# Patient Record
Sex: Female | Born: 1995 | Race: White | Hispanic: No | Marital: Single | Smoking: Never smoker
Health system: Southern US, Community
[De-identification: ages and names within clinical notes are randomized; demographics above are authoritative.]

## PROBLEM LIST (undated history)

## (undated) DIAGNOSIS — B019 Varicella without complication: Secondary | ICD-10-CM

## (undated) DIAGNOSIS — Z20818 Contact with and (suspected) exposure to other bacterial communicable diseases: Secondary | ICD-10-CM

## (undated) HISTORY — PX: WISDOM TOOTH EXTRACTION: SHX21

## (undated) HISTORY — DX: Varicella without complication: B01.9

## (undated) HISTORY — DX: Contact with and (suspected) exposure to other bacterial communicable diseases: Z20.818

## (undated) HISTORY — PX: NO PAST SURGERIES: SHX2092

---
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
## 2015-03-16 ENCOUNTER — Emergency Department (HOSPITAL_COMMUNITY)
Admission: EM | Admit: 2015-03-16 | Discharge: 2015-03-16 | Disposition: A | Discharge status: Home / Self Care | Payer: 59 | Attending: Emergency Medicine | Admitting: Emergency Medicine

## 2015-03-16 ENCOUNTER — Encounter (HOSPITAL_COMMUNITY): Payer: Self-pay

## 2015-03-16 DIAGNOSIS — T783XXA Angioneurotic edema, initial encounter: Secondary | ICD-10-CM | POA: Diagnosis not present

## 2015-03-16 DIAGNOSIS — Z79899 Other long term (current) drug therapy: Secondary | ICD-10-CM | POA: Diagnosis not present

## 2015-03-16 DIAGNOSIS — R220 Localized swelling, mass and lump, head: Secondary | ICD-10-CM | POA: Diagnosis present

## 2015-03-16 DIAGNOSIS — T470X5A Adverse effect of histamine H2-receptor blockers, initial encounter: Secondary | ICD-10-CM | POA: Diagnosis not present

## 2015-03-16 MED ORDER — EPINEPHRINE 0.3 MG/0.3ML IJ SOAJ: 0.3000 mg | Freq: Once | INTRAMUSCULAR | Status: DC

## 2015-03-16 MED ORDER — DIPHENHYDRAMINE HCL 25 MG PO CAPS: 25.0000 mg | ORAL_CAPSULE | Freq: Four times a day (QID) | ORAL | Status: AC | PRN

## 2015-03-16 MED ORDER — DIPHENHYDRAMINE HCL 25 MG PO CAPS
50.0000 mg | ORAL_CAPSULE | Freq: Once | ORAL | Status: AC
  Administered 2015-03-16: 50 mg via ORAL
  Filled 2015-03-16: qty 2

## 2015-03-16 NOTE — ED Notes (Signed)
PT DISCHARGED. INSTRUCTIONS AND PRESCRIPTION GIVEN. AAOX3. PT IN NO APPARENT DISTRESS OR PAIN. THE OPPORTUNITY TO ASK QUESTIONS WAS PROVIDED. 

## 2015-03-16 NOTE — ED Provider Notes (Signed)
CSN: [id number]     Arrival date & time 03/16/15  2000 History   By signing my name below, I, Arlan Organshley Leger, attest that this documentation has been prepared under the direction and in the presence of TRW AutomotiveKelly Aneka Fagerstrom, PA-C.  Electronically Signed: Arlan OrganAshley Leger, ED Scribe. 03/16/2015. 9:00 PM.   Chief Complaint  Patient presents with  . Allergic Reaction   The history is provided by the patient. No language interpreter was used.    HPI Comments: Anne Lynn is a 19 y.o. female without any pertinent past medical history who presents to the Emergency Department here for a possible allergic reaction this evening. Pt states she took a dose of Ranitidine for the first time followed by some swelling to the upper lip. Pt states swelling has mildly improve since onset but is still persistent. No OTC medications or home remedies attempted prior to arrival. No recent fever, chills, nausea, or vomiting. No scratchy throat, wheezing, or inability to swallow.  PCP: Wilber BihariBOETTE,RICHARD W, MD    No past medical history on file. No past surgical history on file. No family history on file. Social History  Substance Use Topics  . Smoking status: Never Smoker   . Smokeless tobacco: None  . Alcohol Use: None   OB History    No data available      Review of Systems  HENT: Positive for facial swelling.   All other systems reviewed and are negative.   Allergies  Other  Home Medications   Prior to Admission medications   Medication Sig Start Date End Date Taking? Authorizing Provider  diphenhydrAMINE (BENADRYL) 25 mg capsule Take 1 capsule (25 mg total) by mouth every 6 (six) hours as needed. 03/16/15   Antony MaduraKelly Miral Hoopes, PA-C  EPINEPHrine 0.3 mg/0.3 mL IJ SOAJ injection Inject 0.3 mLs (0.3 mg total) into the muscle once. Use as needed for throat swelling or difficulty breathing 03/16/15   Antony MaduraKelly Mazal Ebey, PA-C  famotidine (PEPCID) 20 MG tablet Take 1 tablet (20 mg total) by mouth 2 (two) times daily. 11/07/11  11/06/12  Hannah Muthersbaugh, PA-C  Multiple Vitamin (MULTIVITAMIN WITH MINERALS) TABS Take 1 tablet by mouth daily.    Historical Provider, MD   Triage Vitals: BP 115/53 mmHg  Pulse 60  Temp(Src) 98.1 F (36.7 C) (Oral)  Resp 16  SpO2 99%   Physical Exam  Constitutional: She is oriented to person, place, and time. She appears well-developed and well-nourished. No distress.  Nontoxic/nonseptic appearing  HENT:  Head: Normocephalic and atraumatic.  Mouth/Throat: Uvula is midline and mucous membranes are normal. No uvula swelling. No oropharyngeal exudate.  Mild swelling to the upper lip. No posterior oropharyngeal erythema or edema. Uvula midline. Patient tolerating secretions without difficulty.  Eyes: Conjunctivae and EOM are normal. No scleral icterus.  Neck: Normal range of motion.  No stridor. No nuchal rigidity or meningismus  Cardiovascular: Normal rate, regular rhythm and intact distal pulses.   Pulmonary/Chest: Effort normal. No respiratory distress. She has no wheezes. She has no rales.  Respirations even and unlabored. Lungs clear.  Musculoskeletal: Normal range of motion.  Neurological: She is alert and oriented to person, place, and time. She exhibits normal muscle tone. Coordination normal.  GCS 15. Patient ambulatory with steady gait.  Skin: Skin is warm and dry. No rash noted. She is not diaphoretic. No erythema. No pallor.  Psychiatric: She has a normal mood and affect. Her behavior is normal.  Nursing note and vitals reviewed.   ED Course  Procedures (  including critical care time)  DIAGNOSTIC STUDIES: Oxygen Saturation is 100% on RA, Normal by my interpretation.    COORDINATION OF CARE: 8:47 PM- Will give Benadryl. Discussed treatment plan with pt at bedside and pt agreed to plan.     Labs Review Labs Reviewed - No data to display  Imaging Review No results found.   I have personally reviewed and evaluated these images and lab results as part of my  medical decision-making.   EKG Interpretation None      MDM   Final diagnoses:  Angioedema of lips, initial encounter    19 year old female presents to the emergency department for evaluation of upper lip swelling after taking Zantac. She reports that symptoms have spontaneously improved prior to arrival. Patient given Benadryl and has been monitored for approximately 1 hour with no worsening of her symptoms. Oropharynx is clear. Patient is tolerating secretions without difficulty. Will discharge with Benadryl as well as EpiPen for worsening of symptoms as needed. Return precautions given at discharge. Patient discharged in good condition with no unaddressed concerns; VSS.  I personally performed the services described in this documentation, which was scribed in my presence. The recorded information has been reviewed and is accurate.    Filed Vitals:   03/16/15 2009 03/16/15 2148  BP: 130/73 115/53  Pulse: 68 60  Temp: 97.8 F (36.6 C) 98.1 F (36.7 C)  TempSrc: Oral Oral  Resp: 16 16  SpO2: 100% 99%     Antony Madura, PA-C 03/16/15 2210  Gwyneth Sprout, MD 03/20/15 0900

## 2015-03-16 NOTE — ED Notes (Signed)
Pt reports taking a ranitidine tablet for the first time for heartburn at home and noticed her upper lip beginning to swell. No respiratory complaints in triage. Upper lip appears slightly swollen. Throat and tongue appear normal at this time.

## 2015-03-16 NOTE — ED Notes (Signed)
INITIAL ASSESSMENT COMPLETED. PT C/O SWELLING TO THE UPPER LIP AFTER TAKING A ZANTAC PILL FOR THE FIRST TIME. PT DENIES RESPIRATORY DISTRESS OR DIFFICULTY SWALLOWING. AWAITING FURTHER ORDERS.

## 2015-03-16 NOTE — Discharge Instructions (Signed)
Angioedema  Angioedema is a sudden swelling of tissues, often of the skin. It can occur on the face or genitals or in the abdomen or other body parts. The swelling usually develops over a short period and gets better in 24 to 48 hours. It often begins during the night and is found when the person wakes up. The person may also get red, itchy patches of skin (hives). Angioedema can be dangerous if it involves swelling of the air passages.   Depending on the cause, episodes of angioedema may only happen once, come back in unpredictable patterns, or repeat for several years and then gradually fade away.   CAUSES   Angioedema can be caused by an allergic reaction to various triggers. It can also result from nonallergic causes, including reactions to drugs, immune system disorders, viral infections, or an abnormal gene that is passed to you from your parents (hereditary). For some people with angioedema, the cause is unknown.   Some things that can trigger angioedema include:    Foods.    Medicines, such as ACE inhibitors, ARBs, nonsteroidal anti-inflammatory agents, or estrogen.    Latex.    Animal saliva.    Insect stings.    Dyes used in X-rays.    Mild injury.    Dental work.   Surgery.   Stress.    Sudden changes in temperature.    Exercise.  SIGNS AND SYMPTOMS    Swelling of the skin.   Hives. If these are present, there is also intense itching.   Redness in the affected area.    Pain in the affected area.   Swollen lips or tongue.   Breathing problems. This may happen if the air passages swell.   Wheezing.  If internal organs are involved, there may be:    Nausea.    Abdominal pain.    Vomiting.    Difficulty swallowing.    Difficulty passing urine.  DIAGNOSIS    Your health care provider will examine the affected area and take a medical and family history.   Various tests may be done to help determine the cause. Tests may include:   Allergy skin tests to see if the problem  is an allergic reaction.    Blood tests to check for hereditary angioedema.    Tests to check for underlying diseases that could cause the condition.    A review of your medicines, including over-the-counter medicines, may be done.  TREATMENT   Treatment will depend on the cause of the angioedema. Possible treatments include:    Removal of anything that triggered the condition (such as stopping certain medicines).    Medicines to treat symptoms or prevent attacks. Medicines given may include:     Antihistamines.     Epinephrine injection.     Steroids.    Hospitalization may be required for severe attacks. If the air passages are affected, it can be an emergency. Tubes may need to be placed to keep the airway open.  HOME CARE INSTRUCTIONS    Take all medicines as directed by your health care provider.   If you were given medicines for emergency allergy treatment, always carry them with you.   Wear a medical bracelet as directed by your health care provider.    Avoid known triggers.  SEEK MEDICAL CARE IF:    You have repeat attacks of angioedema.    Your attacks are more frequent or more severe despite preventive measures.    You have hereditary angioedema   and are considering having children. It is important to discuss with your health care provider the risks of passing the condition on to your children.  SEEK IMMEDIATE MEDICAL CARE IF:    You have severe swelling of the mouth, tongue, or lips.   You have difficulty breathing.    You have difficulty swallowing.    You faint.  MAKE SURE YOU:   Understand these instructions.   Will watch your condition.   Will get help right away if you are not doing well or get worse.     This information is not intended to replace advice given to you by your health care provider. Make sure you discuss any questions you have with your health care provider.     Document Released: 05/24/2001 Document Revised: 04/05/2014 Document Reviewed:  11/06/2012  [employer] Interactive Patient Education 2016 [employer]

## 2015-09-04 ENCOUNTER — Ambulatory Visit: Payer: Managed Care, Other (non HMO) | Admitting: Allergy and Immunology

## 2015-10-01 ENCOUNTER — Encounter: Payer: Self-pay | Admitting: Allergy and Immunology

## 2015-10-01 ENCOUNTER — Ambulatory Visit (INDEPENDENT_AMBULATORY_CARE_PROVIDER_SITE_OTHER): Payer: 59 | Admitting: Allergy and Immunology

## 2015-10-01 VITALS — BP 100/68 | HR 72 | Temp 98.4°F | Resp 16 | Ht 65.5 in | Wt 172.2 lb

## 2015-10-01 DIAGNOSIS — Z91018 Allergy to other foods: Secondary | ICD-10-CM

## 2015-10-01 MED ORDER — EPINEPHRINE 0.3 MG/0.3ML IJ SOAJ: INTRAMUSCULAR | Status: DC

## 2015-10-01 NOTE — Progress Notes (Signed)
NEW PATIENT NOTE  Referring Provider: No ref. provider found Primary Provider: No PCP Per Patient Date of office visit: 10/01/2015    Subjective:   Chief Complaint:  Anne Lynn (DOB: [birth year]) is a 20 y.o. female with a chief complaint of Allergic Reaction  who presents to the clinic on 10/01/2015 with the following problems:  HPI: Anne Lynn presents to this clinic in evaluation of 2 allergic reactions. 18 months ago after eating whitefish and sauce she develop diffuse urticaria within approximately 30 minutes possibly associated with some shortness of breath for which she went to the emergency room and was treated with IV medications which made her quite sleepy. Her entire reaction was over within several hours and she never had any associated systemic or constitutional symptoms. Her lesions never healed with a scar or hyperpigmentation. There was no obvious trigger giving rise to this reaction except for possibly consumption of food products as described. Since that point in time she can eat salmon without any problem but stays away from all whitefish. She was given a prescription for an EpiPen in the emergency room but never filled this prescription.  12 months ago she developed what she felt was pressure in her chest and fullness in her chest and she took ranitidine. Within 10 minutes she develop diffuse lip swelling that lasted several hours without any associated systemic or constitutional symptoms. Her lip swelling and her "heartburn" did resolve within several hours. There was no obvious provoking factor giving rise to this issue.  She does not have an atopic history. She has no allergic rhinitis or asthma or sensitivity to various allergens in the environment.  No past medical history on file.  Past Surgical History  Procedure Laterality Date  . No past surgeries        Medication List           diphenhydrAMINE 25 mg capsule  Commonly known as:  BENADRYL  Take 1  capsule (25 mg total) by mouth every 6 (six) hours as needed.     multivitamin with minerals Tabs tablet  Take 1 tablet by mouth daily.     TAYTULLA 1-20 MG-MCG(24) Caps  Generic drug:  Norethin Ace-Eth Estrad-FE  TK 1 C PO QD        Allergies  Allergen Reactions  . Other Other (See Comments)    Fish:per patients parents it is unknown which fish she is allergic too. She can eat shellfish.    Review of systems negative except as noted in HPI / PMHx or noted below:  Review of Systems  Constitutional: Negative.   HENT: Negative.   Eyes: Negative.   Respiratory: Negative.   Cardiovascular: Negative.   Gastrointestinal: Negative.   Genitourinary: Negative.   Musculoskeletal: Negative.   Skin: Negative.   Neurological: Negative.   Endo/Heme/Allergies: Negative.   Psychiatric/Behavioral: Negative.     Family History  Problem Relation Age of Onset  . Allergic rhinitis Neg Hx   . Angioedema Neg Hx   . Asthma Neg Hx   . Eczema Neg Hx   . Immunodeficiency Neg Hx   . Urticaria Neg Hx     Social History   Social History  . Marital Status: Single    Spouse Name: N/A  . Number of Children: N/A  . Years of Education: N/A   Occupational History  . Not on file.   Social History Main Topics  . Smoking status: Never Smoker   . Smokeless tobacco: Not  on file  . Alcohol Use: Not on file  . Drug Use: Not on file  . Sexual Activity: Not on file   Other Topics Concern  . Not on file   Social History Narrative    Environmental and Social history  Lives in a townhouse with a dry environment, a dog located inside the household, carpeting in the bedroom, no plastic on the bed or pillow, and no smokers located inside the household. She is a Consulting civil engineerstudent at [employer].   Objective:   Filed Vitals:   10/01/15 0931  BP: 100/68  Pulse: 72  Temp: 98.4 F (36.9 C)  Resp: 16   Height: 5' 5.5" (166.4 cm) Weight: 172 lb 2.9 oz (78.1 kg)  Physical Exam    Constitutional: She is well-developed, well-nourished, and in no distress.  HENT:  Head: Normocephalic.  Right Ear: Tympanic membrane, external ear and ear canal normal.  Left Ear: Tympanic membrane, external ear and ear canal normal.  Nose: Nose normal. No mucosal edema or rhinorrhea.  Mouth/Throat: Uvula is midline, oropharynx is clear and moist and mucous membranes are normal. No oropharyngeal exudate.  Eyes: Conjunctivae are normal.  Neck: Trachea normal. No tracheal tenderness present. No tracheal deviation present. No thyromegaly present.  Cardiovascular: Normal rate, regular rhythm, S1 normal, S2 normal and normal heart sounds.   No murmur heard. Pulmonary/Chest: Breath sounds normal. No stridor. No respiratory distress. She has no wheezes. She has no rales.  Musculoskeletal: She exhibits no edema.  Lymphadenopathy:       Head (right side): No tonsillar adenopathy present.       Head (left side): No tonsillar adenopathy present.    She has no cervical adenopathy.  Neurological: She is alert. Gait normal.  Skin: No rash noted. She is not diaphoretic. No erythema. Nails show no clubbing.  Psychiatric: Mood and affect normal.     Diagnostics: Allergy skin tests were performed. She did not demonstrate any hypersensitivity against a screening panel of foods.   Assessment and Plan:    1. Food allergy     1. Allergen avoidance measures?  2. AUVI-Q 3.0 , Benadryl, M.D. ER for allergic reaction (specialty pharmacy program)  3. Further evaluation? Yes, if recurrent  At this point in time Anne Lynn will avoid all whitefish consumption and we'll have her obtain a epinephrine autoinjector device and assume that she will not require any further evaluation or treatment for her condition. Certainly if her reactions are more frequent or more intense then she will require further evaluation and she will contact me at that point in time. [place] is visiting [place] in the next 1-[place]2 weeks and  I've advised her to make sure that she does have the epinephrine autoinjector on hand as she will be vacationing and lose some control of what food she consumes while on vacation.  Laurette SchimkeEric Kozlow, MD [facility]

## 2015-10-01 NOTE — Patient Instructions (Signed)
  1. Allergen avoidance measures?  2. AUVI-Q 3.0 , Benadryl, M.D. ER for allergic reaction (specialty pharmacy program)  3. Further evaluation? Yes, if recurrent

## 2016-08-30 ENCOUNTER — Ambulatory Visit (INDEPENDENT_AMBULATORY_CARE_PROVIDER_SITE_OTHER): Payer: 59 | Admitting: Nurse Practitioner

## 2016-08-30 ENCOUNTER — Other Ambulatory Visit (INDEPENDENT_AMBULATORY_CARE_PROVIDER_SITE_OTHER): Payer: 59

## 2016-08-30 ENCOUNTER — Encounter: Payer: Self-pay | Admitting: Nurse Practitioner

## 2016-08-30 VITALS — BP 118/62 | HR 50 | Temp 98.6°F | Ht 66.0 in | Wt 186.0 lb

## 2016-08-30 DIAGNOSIS — Z91013 Allergy to seafood: Secondary | ICD-10-CM | POA: Insufficient documentation

## 2016-08-30 DIAGNOSIS — Z0000 Encounter for general adult medical examination without abnormal findings: Secondary | ICD-10-CM

## 2016-08-30 LAB — TSH: TSH: 1.8 u[IU]/mL (ref 0.35–5.50)

## 2016-08-30 LAB — COMPREHENSIVE METABOLIC PANEL
ALT: 16 U/L (ref 0–35)
AST: 17 U/L (ref 0–37)
Albumin: 4.6 g/dL (ref 3.5–5.2)
Alkaline Phosphatase: 40 U/L (ref 39–117)
BUN: 10 mg/dL (ref 6–23)
CHLORIDE: 106 meq/L (ref 96–112)
CO2: 26 meq/L (ref 19–32)
Calcium: 9.5 mg/dL (ref 8.4–10.5)
Creatinine, Ser: 0.82 mg/dL (ref 0.40–1.20)
GFR: 93.56 mL/min (ref >60.00)
Glucose, Bld: 93 mg/dL (ref 70–99)
Potassium: 4 mEq/L (ref 3.5–5.1)
Sodium: 138 mEq/L (ref 135–145)
Total Bilirubin: 0.3 mg/dL (ref 0.2–1.2)
Total Protein: 7.4 g/dL (ref 6.0–8.3)

## 2016-08-30 LAB — CBC
HCT: 41.6 % (ref 36.0–46.0)
HEMOGLOBIN: 14 g/dL (ref 12.0–15.0)
MCHC: 33.7 g/dL (ref 30.0–36.0)
MCV: 90 fl (ref 78.0–100.0)
PLATELETS: 210 10*3/uL (ref 150.0–400.0)
RBC: 4.63 Mil/uL (ref 3.87–5.11)
RDW: 12.4 % (ref 11.5–14.6)
WBC: 7.3 10*3/uL (ref 4.5–10.5)

## 2016-08-30 MED ORDER — EPINEPHRINE 0.3 MG/0.3ML IJ SOAJ: INTRAMUSCULAR | 1 refills | Status: DC

## 2016-08-30 NOTE — Progress Notes (Signed)
Subjective:    Patient ID: Anne Lynn, female    DOB: 07/01/1995, 20 y.o.   MRN: [id number]  Patient presents today for complete physical (new patient).  HPI  [employer] student.  Cold Intolerance: Onset 2015. Getting worse. Has regular menstrual cycle with OCP (not heavy, no blood clots)  Immunizations: (TDAP, Hep C screen, Pneumovax, Influenza, zoster)  Health Maintenance  Topic Date Due  . HIV Screening  08/29/2017*  . Flu Shot  10/27/2016  . Tetanus Vaccine  08/28/2023  *Topic was postponed. The date shown is not the original due date.   Diet:regular.  Weight:  Wt Readings from Last 3 Encounters:  08/30/16 186 lb (84.4 kg)  10/01/15 172 lb 2.9 oz (78.1 kg)  11/06/11 150 lb (68 kg) (87 %, Z= 1.14)*   * Growth percentiles are based on CDC 2-20 Years data.   Exercise:swimming and valley [place] daily.  Fall Risk: Fall Risk  08/30/2016  Falls in the past year? No   Home Safety:home with parents at this time.  Depression/Suicide: Depression screen PHQ 2/9 08/30/2016  Decreased Interest 0  Down, Depressed, Hopeless 0  PHQ - 2 Score 0   Vision: needed, will schedule.  Dental:needed, will schedule.  Sexual History (birth control, marital status, STD):current use of OCP, never sexually active (virgin).  Medications and allergies reviewed with patient and updated if appropriate.  There are no active problems to display for this patient.   Current Outpatient Prescriptions on File Prior to Visit  Medication Sig Dispense Refill  . TAYTULLA 1-20 MG-MCG(24) CAPS TK 1 C PO QD  6  . diphenhydrAMINE (BENADRYL) 25 mg capsule Take 1 capsule (25 mg total) by mouth every 6 (six) hours as needed. (Patient not taking: Reported on 08/30/2016) 30 capsule 0  . Multiple Vitamin (MULTIVITAMIN WITH MINERALS) TABS Take 1 tablet by mouth daily.     No current facility-administered medications on file prior to visit.     Past Medical History:  Diagnosis Date  . Chicken pox   . MRSA  exposure    long time ago    Past Surgical History:  Procedure Laterality Date  . NO PAST SURGERIES    . WISDOM TOOTH EXTRACTION      Social History   Social History  . Marital status: Single    Spouse name: N/A  . Number of children: N/A  . Years of education: N/A   Social History Main Topics  . Smoking status: Never Smoker  . Smokeless tobacco: Never Used  . Alcohol use No  . Drug use: No  . Sexual activity: Not Asked   Other Topics Concern  . None   Social History Narrative  . None    Family History  Problem Relation Age of Onset  . Arthritis Maternal Grandmother   . Hypertension Maternal Grandmother   . Kidney disease Maternal Grandmother   . Arthritis Maternal Grandfather   . Diabetes Paternal Grandmother   . Stroke Paternal Grandfather   . Heart disease Paternal Grandfather   . Allergic rhinitis Neg Hx   . Angioedema Neg Hx   . Asthma Neg Hx   . Eczema Neg Hx   . Immunodeficiency Neg Hx   . Urticaria Neg Hx         Review of Systems  Constitutional: Negative for fever, malaise/fatigue and weight loss.  HENT: Negative for congestion and sore throat.   Eyes:       Negative for visual changes  Respiratory: Negative  for cough and shortness of breath.   Cardiovascular: Negative for chest pain, palpitations and leg swelling.  Gastrointestinal: Negative for blood in stool, constipation, diarrhea and heartburn.  Genitourinary: Negative for dysuria, frequency and urgency.  Musculoskeletal: Negative for falls, joint pain and myalgias.  Skin: Negative for rash.  Neurological: Negative for dizziness, sensory change and headaches.  Endo/Heme/Allergies: Does not bruise/bleed easily.  Psychiatric/Behavioral: Negative for depression, substance abuse and suicidal ideas. The patient is not nervous/anxious.     Objective:   Vitals:   08/30/16 1420  BP: 118/62  Pulse: (!) 50  Temp: 98.6 F (37 C)    Body mass index is 30.02 kg/m.   Physical  Examination:  Physical Exam  Constitutional: She is oriented to person, place, and time and well-developed, well-nourished, and in no distress. No distress.  HENT:  Right Ear: External ear normal.  Left Ear: External ear normal.  Nose: Nose normal.  Mouth/Throat: Oropharynx is clear and moist. No oropharyngeal exudate.  Eyes: Conjunctivae and EOM are normal. Pupils are equal, round, and reactive to light. No scleral icterus.  Neck: Normal range of motion. Neck supple. No thyromegaly present.  Cardiovascular: Normal rate, normal heart sounds and intact distal pulses.   Pulmonary/Chest: Effort normal and breath sounds normal. She exhibits no tenderness.  Abdominal: Soft. Bowel sounds are normal. She exhibits no distension. There is no tenderness.  Musculoskeletal: Normal range of motion. She exhibits no edema or tenderness.  Lymphadenopathy:    She has no cervical adenopathy.  Neurological: She is alert and oriented to person, place, and time. She has normal reflexes. No cranial nerve deficit. Gait normal.  Skin: Skin is warm and dry.  Psychiatric: Affect and judgment normal.  Vitals reviewed.   ASSESSMENT and PLAN:  Anne Lynn was seen today for establish care.  Diagnoses and all orders for this visit:  Preventative health care -     CBC; Future -     Comprehensive metabolic panel; Future -     TSH; Future  Fish allergy -     EPINEPHrine (AUVI-Q) 0.3 mg/0.3 mL IJ SOAJ injection; Use as directed for severe allergic reaction   No problem-specific Assessment & Plan notes found for this encounter.     Follow up: Return if symptoms worsen or fail to improve.  Alysia Penna, NP

## 2016-08-30 NOTE — Patient Instructions (Addendum)
Please go to basement for blood draw. You will be called with lab results.  Please bring current immunization records.  Let me know if you need refill on oral contraceptive.  Breast Self-Awareness Breast self-awareness means:  Knowing how your breasts look.  Knowing how your breasts feel.  Checking your breasts every month for changes.  Telling your doctor if you notice a change in your breasts.  Breast self-awareness allows you to notice a breast problem early while it is still small. How to do a breast self-exam One way to learn what is normal for your breasts and to check for changes is to do a breast self-exam. To do a breast self-exam: Look for Changes  1. Take off all the clothes above your waist. 2. Stand in front of a mirror in a room with good lighting. 3. Put your hands on your hips. 4. Push your hands down. 5. Look at your breasts and nipples in the mirror to see if one breast or nipple looks different than the other. Check to see if: ? The shape of one breast is different. ? The size of one breast is different. ? There are wrinkles, dips, and bumps in one breast and not the other. 6. Look at each breast for changes in your skin, such as: ? Redness. ? Scaly areas. 7. Look for changes in your nipples, such as: ? Liquid around the nipples. ? Bleeding. ? Dimpling. ? Redness. ? A change in where the nipples are. Feel for Changes 1. Lie on your back on the floor. 2. Feel each breast. To do this, follow these steps: ? Pick a breast to feel. ? Put the arm closest to that breast above your head. ? Use your other arm to feel the nipple area of your breast. Feel the area with the pads of your three middle fingers by making small circles with your fingers. For the first circle, press lightly. For the second circle, press harder. For the third circle, press even harder. ? Keep making circles with your fingers at the light, harder, and even harder pressures as you move  down your breast. Stop when you feel your ribs. ? Move your fingers a little toward the center of your body. ? Start making circles with your fingers again, this time going up until you reach your collarbone. ? Keep making up and down circles until you reach your armpit. Remember to keep using the three pressures. ? Feel the other breast in the same way. 3. Sit or stand in the shower or tub. 4. With soapy water on your skin, feel each breast the same way you did in step 2, when you were lying on the floor. Write Down What You Find  After doing the self-exam, write down:  What is normal for each breast.  Any changes you find in each breast.  When you last had your period.  How often should I check my breasts? Check your breasts every month. If you are breastfeeding, the best time to check them is after you feed your baby or after you use a breast pump. If you get periods, the best time to check your breasts is 5-7 days after your period is over. When should I see my doctor? See your doctor if you notice:  A change in shape or size of your breasts or nipples.  A change in the skin of your breast or nipples, such as red or scaly skin.  Unusual fluid coming from your nipples.    A lump or thick area that was not there before.  Pain in your breasts.  Anything that concerns you.  This information is not intended to replace advice given to you by your health care provider. Make sure you discuss any questions you have with your health care provider. Document Released: 09/01/2007 Document Revised: 08/21/2015 Document Reviewed: 02/02/2015 Elsevier Interactive Patient Education  2018 [employer]  Health Maintenance, Female Adopting a healthy lifestyle and getting preventive care can go a long way to promote health and wellness. Talk with your health care provider about what schedule of regular examinations is right for you. This is a good chance for you to check in with your provider  about disease prevention and staying healthy. In between checkups, there are plenty of things you can do on your own. Experts have done a lot of research about which lifestyle changes and preventive measures are most likely to keep you healthy. Ask your health care provider for more information. Weight and diet Eat a healthy diet  Be sure to include plenty of vegetables, fruits, low-fat dairy products, and lean protein.  Do not eat a lot of foods high in solid fats, added sugars, or salt.  Get regular exercise. This is one of the most important things you can do for your health. ? Most adults should exercise for at least 150 minutes each week. The exercise should increase your heart rate and make you sweat (moderate-intensity exercise). ? Most adults should also do strengthening exercises at least twice a week. This is in addition to the moderate-intensity exercise.  Maintain a healthy weight  Body mass index (BMI) is a measurement that can be used to identify possible weight problems. It estimates body fat based on height and weight. Your health care provider can help determine your BMI and help you achieve or maintain a healthy weight.  For females 20 years of age and older: ? A BMI below 18.5 is considered underweight. ? A BMI of 18.5 to 24.9 is normal. ? A BMI of 25 to 29.9 is considered overweight. ? A BMI of 30 and above is considered obese.  Watch levels of cholesterol and blood lipids  You should start having your blood tested for lipids and cholesterol at 20 years of age, then have this test every 5 years.  You may need to have your cholesterol levels checked more often if: ? Your lipid or cholesterol levels are high. ? You are older than 21 years of age. ? You are at high risk for heart disease.  Cancer screening Lung Cancer  Lung cancer screening is recommended for adults 55-80 years old who are at high risk for lung cancer because of a history of smoking.  A yearly  low-dose CT scan of the lungs is recommended for people who: ? Currently smoke. ? Have quit within the past 15 years. ? Have at least a 30-pack-year history of smoking. A pack year is smoking an average of one pack of cigarettes a day for 1 year.  Yearly screening should continue until it has been 15 years since you quit.  Yearly screening should stop if you develop a health problem that would prevent you from having lung cancer treatment.  Breast Cancer  Practice breast self-awareness. This means understanding how your breasts normally appear and feel.  It also means doing regular breast self-exams. Let your health care provider know about any changes, no matter how small.  If you are in your 20s or 30s, you   should have a clinical breast exam (CBE) by a health care provider every 1-3 years as part of a regular health exam.  If you are 40 or older, have a CBE every year. Also consider having a breast X-ray (mammogram) every year.  If you have a family history of breast cancer, talk to your health care provider about genetic screening.  If you are at high risk for breast cancer, talk to your health care provider about having an MRI and a mammogram every year.  Breast cancer gene (BRCA) assessment is recommended for women who have family members with BRCA-related cancers. BRCA-related cancers include: ? Breast. ? Ovarian. ? Tubal. ? Peritoneal cancers.  Results of the assessment will determine the need for genetic counseling and BRCA1 and BRCA2 testing.  Cervical Cancer Your health care provider may recommend that you be screened regularly for cancer of the pelvic organs (ovaries, uterus, and vagina). This screening involves a pelvic examination, including checking for microscopic changes to the surface of your cervix (Pap test). You may be encouraged to have this screening done every 3 years, beginning at age 73.  For women ages 10-65, health care providers may recommend pelvic exams  and Pap testing every 3 years, or they may recommend the Pap and pelvic exam, combined with testing for human papilloma virus (HPV), every 5 years. Some types of HPV increase your risk of cervical cancer. Testing for HPV may also be done on women of any age with unclear Pap test results.  Other health care providers may not recommend any screening for nonpregnant women who are considered low risk for pelvic cancer and who do not have symptoms. Ask your health care provider if a screening pelvic exam is right for you.  If you have had past treatment for cervical cancer or a condition that could lead to cancer, you need Pap tests and screening for cancer for at least 20 years after your treatment. If Pap tests have been discontinued, your risk factors (such as having a new sexual partner) need to be reassessed to determine if screening should resume. Some women have medical problems that increase the chance of getting cervical cancer. In these cases, your health care provider may recommend more frequent screening and Pap tests.  Colorectal Cancer  This type of cancer can be detected and often prevented.  Routine colorectal cancer screening usually begins at 21 years of age and continues through 21 years of age.  Your health care provider may recommend screening at an earlier age if you have risk factors for colon cancer.  Your health care provider may also recommend using home test kits to check for hidden blood in the stool.  A small camera at the end of a tube can be used to examine your colon directly (sigmoidoscopy or colonoscopy). This is done to check for the earliest forms of colorectal cancer.  Routine screening usually begins at age 49.  Direct examination of the colon should be repeated every 5-10 years through 21 years of age. However, you may need to be screened more often if early forms of precancerous polyps or small growths are found.  Skin Cancer  Check your skin from head to  toe regularly.  Tell your health care provider about any new moles or changes in moles, especially if there is a change in a mole's shape or color.  Also tell your health care provider if you have a mole that is larger than the size of a pencil eraser.  Always use sunscreen. Apply sunscreen liberally and repeatedly throughout the day.  Protect yourself by wearing long sleeves, pants, a wide-brimmed hat, and sunglasses whenever you are outside.  Heart disease, diabetes, and high blood pressure  High blood pressure causes heart disease and increases the risk of stroke. High blood pressure is more likely to develop in: ? People who have blood pressure in the high end of the normal range (130-139/85-89 mm Hg). ? People who are overweight or obese. ? People who are African American.  If you are 51-53 years of age, have your blood pressure checked every 3-5 years. If you are 47 years of age or older, have your blood pressure checked every year. You should have your blood pressure measured twice-once when you are at a hospital or clinic, and once when you are not at a hospital or clinic. Record the average of the two measurements. To check your blood pressure when you are not at a hospital or clinic, you can use: ? An automated blood pressure machine at a pharmacy. ? A home blood pressure monitor.  If you are between 67 years and 62 years old, ask your health care provider if you should take aspirin to prevent strokes.  Have regular diabetes screenings. This involves taking a blood sample to check your fasting blood sugar level. ? If you are at a normal weight and have a low risk for diabetes, have this test once every three years after 21 years of age. ? If you are overweight and have a high risk for diabetes, consider being tested at a younger age or more often. Preventing infection Hepatitis B  If you have a higher risk for hepatitis B, you should be screened for this virus. You are  considered at high risk for hepatitis B if: ? You were born in a country where hepatitis B is common. Ask your health care provider which countries are considered high risk. ? Your parents were born in a high-risk country, and you have not been immunized against hepatitis B (hepatitis B vaccine). ? You have HIV or AIDS. ? You use needles to inject street drugs. ? You live with someone who has hepatitis B. ? You have had sex with someone who has hepatitis B. ? You get hemodialysis treatment. ? You take certain medicines for conditions, including cancer, organ transplantation, and autoimmune conditions.  Hepatitis C  Blood testing is recommended for: ? Everyone born from [birth year] through 1965. ? Anyone with known risk factors for hepatitis C.  Sexually transmitted infections (STIs)  You should be screened for sexually transmitted infections (STIs) including gonorrhea and chlamydia if: ? You are sexually active and are younger than 21 years of age. ? You are older than 21 years of age and your health care provider tells you that you are at risk for this type of infection. ? Your sexual activity has changed since you were last screened and you are at an increased risk for chlamydia or gonorrhea. Ask your health care provider if you are at risk.  If you do not have HIV, but are at risk, it may be recommended that you take a prescription medicine daily to prevent HIV infection. This is called pre-exposure prophylaxis (PrEP). You are considered at risk if: ? You are sexually active and do not regularly use condoms or know the HIV status of your partner(s). ? You take drugs by injection. ? You are sexually active with a partner who has HIV.  Talk with your  health care provider about whether you are at high risk of being infected with HIV. If you choose to begin PrEP, you should first be tested for HIV. You should then be tested every 3 months for as long as you are taking PrEP. Pregnancy  If you  are premenopausal and you may become pregnant, ask your health care provider about preconception counseling.  If you may become pregnant, take 400 to 800 micrograms (mcg) of folic acid every day.  If you want to prevent pregnancy, talk to your health care provider about birth control (contraception). Osteoporosis and menopause  Osteoporosis is a disease in which the bones lose minerals and strength with aging. This can result in serious bone fractures. Your risk for osteoporosis can be identified using a bone density scan.  If you are 44 years of age or older, or if you are at risk for osteoporosis and fractures, ask your health care provider if you should be screened.  Ask your health care provider whether you should take a calcium or vitamin D supplement to lower your risk for osteoporosis.  Menopause may have certain physical symptoms and risks.  Hormone replacement therapy may reduce some of these symptoms and risks. Talk to your health care provider about whether hormone replacement therapy is right for you. Follow these instructions at home:  Schedule regular health, dental, and eye exams.  Stay current with your immunizations.  Do not use any tobacco products including cigarettes, chewing tobacco, or electronic cigarettes.  If you are pregnant, do not drink alcohol.  If you are breastfeeding, limit how much and how often you drink alcohol.  Limit alcohol intake to no more than 1 drink per day for nonpregnant women. One drink equals 12 ounces of beer, 5 ounces of wine, or 1 ounces of hard liquor.  Do not use street drugs.  Do not share needles.  Ask your health care provider for help if you need support or information about quitting drugs.  Tell your health care provider if you often feel depressed.  Tell your health care provider if you have ever been abused or do not feel safe at home. This information is not intended to replace advice given to you by your health care  provider. Make sure you discuss any questions you have with your health care provider. Document Released: 09/28/2010 Document Revised: 08/21/2015 Document Reviewed: 12/17/2014 Elsevier Interactive Patient Education  [employer].

## 2016-09-02 ENCOUNTER — Telehealth: Payer: Self-pay | Admitting: *Deleted

## 2016-09-02 DIAGNOSIS — Z91013 Allergy to seafood: Secondary | ICD-10-CM

## 2016-09-02 MED ORDER — EPINEPHRINE 0.3 MG/0.3ML IJ SOAJ: 0.3000 mg | Freq: Once | INTRAMUSCULAR | 1 refills | Status: AC

## 2016-09-02 NOTE — Telephone Encounter (Signed)
Pharmacy left msg on triage stating the rx for Epinephrine (Auvi-Q) is not available. Wanting to know if they can switch to just Epinephrine .3 mg.../LMB

## 2016-09-02 NOTE — Telephone Encounter (Signed)
I am not given the option to order just epinephrine 0.3mg . Pharmacist can substitute to brand cover by patient's insurance. thank

## 2016-09-02 NOTE — Telephone Encounter (Signed)
Sent rx for epinephrine (Epi-pen)...Raechel Chute/lmb
